# Patient Record
Sex: Male | Born: 1975 | Hispanic: No | Marital: Single | Smoking: Former smoker
Health system: Southern US, Community
[De-identification: ages and names within clinical notes are randomized; demographics above are authoritative.]

## PROBLEM LIST (undated history)

## (undated) ENCOUNTER — Emergency Department (HOSPITAL_COMMUNITY): Payer: Medicaid Other | Source: Home / Self Care

## (undated) DIAGNOSIS — E781 Pure hyperglyceridemia: Secondary | ICD-10-CM

## (undated) DIAGNOSIS — I10 Essential (primary) hypertension: Secondary | ICD-10-CM

## (undated) DIAGNOSIS — R7303 Prediabetes: Secondary | ICD-10-CM

## (undated) DIAGNOSIS — F1010 Alcohol abuse, uncomplicated: Secondary | ICD-10-CM

## (undated) DIAGNOSIS — F1210 Cannabis abuse, uncomplicated: Secondary | ICD-10-CM

## (undated) DIAGNOSIS — M549 Dorsalgia, unspecified: Secondary | ICD-10-CM

## (undated) HISTORY — DX: Cannabis abuse, uncomplicated: F12.10

## (undated) HISTORY — DX: Alcohol abuse, uncomplicated: F10.10

## (undated) HISTORY — DX: Essential (primary) hypertension: I10

## (undated) HISTORY — DX: Prediabetes: R73.03

## (undated) HISTORY — PX: KNEE SURGERY: SHX244

## (undated) HISTORY — DX: Pure hyperglyceridemia: E78.1

---

## 2003-02-28 ENCOUNTER — Encounter: Payer: Self-pay | Admitting: Internal Medicine

## 2003-02-28 ENCOUNTER — Emergency Department (HOSPITAL_COMMUNITY): Admission: EM | Admit: 2003-02-28 | Discharge: 2003-02-28 | Payer: Self-pay | Admitting: Internal Medicine

## 2003-05-18 ENCOUNTER — Ambulatory Visit (HOSPITAL_COMMUNITY): Admission: RE | Admit: 2003-05-18 | Discharge: 2003-05-18 | Payer: Self-pay | Admitting: General Surgery

## 2004-01-13 ENCOUNTER — Emergency Department (HOSPITAL_COMMUNITY): Admission: EM | Admit: 2004-01-13 | Discharge: 2004-01-13 | Payer: Self-pay | Admitting: Emergency Medicine

## 2005-11-22 ENCOUNTER — Emergency Department (HOSPITAL_COMMUNITY): Admission: EM | Admit: 2005-11-22 | Discharge: 2005-11-22 | Payer: Self-pay | Admitting: Emergency Medicine

## 2007-04-13 ENCOUNTER — Emergency Department (HOSPITAL_COMMUNITY): Admission: EM | Admit: 2007-04-13 | Discharge: 2007-04-13 | Payer: Self-pay | Admitting: Emergency Medicine

## 2010-05-28 ENCOUNTER — Encounter: Payer: Self-pay | Admitting: Orthopedic Surgery
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
## 2011-02-12 LAB — CBC
HCT: 42.7
Hemoglobin: 14.4
MCHC: 33.8
MCV: 89.1
Platelets: 241
RBC: 4.8
RDW: 13.3
WBC: 6

## 2011-02-12 LAB — URINALYSIS, ROUTINE W REFLEX MICROSCOPIC
Glucose, UA: 100 — AB
pH: 6

## 2011-02-12 LAB — DIFFERENTIAL
Eosinophils Relative: 1
Lymphocytes Relative: 20
Monocytes Relative: 8
Neutrophils Relative %: 71

## 2011-02-12 LAB — COMPREHENSIVE METABOLIC PANEL
BUN: 13
CO2: 27
Calcium: 9.6
Creatinine, Ser: 1.3
GFR calc Af Amer: >60
GFR calc non Af Amer: >60
Glucose, Bld: 121 — ABNORMAL HIGH

## 2011-02-12 LAB — RAPID URINE DRUG SCREEN, HOSP PERFORMED
Barbiturates: NOT DETECTED
Benzodiazepines: POSITIVE — AB
Cocaine: POSITIVE — AB

## 2011-05-04 ENCOUNTER — Emergency Department (HOSPITAL_COMMUNITY)
Admission: EM | Admit: 2011-05-04 | Discharge: 2011-05-04 | Disposition: A | Discharge status: Home / Self Care | Payer: Medicaid Other | Attending: Emergency Medicine | Admitting: Emergency Medicine

## 2011-05-04 ENCOUNTER — Emergency Department (HOSPITAL_COMMUNITY): Payer: Medicaid Other

## 2011-05-04 ENCOUNTER — Encounter: Payer: Self-pay | Admitting: Emergency Medicine

## 2011-05-04 DIAGNOSIS — S62307A Unspecified fracture of fifth metacarpal bone, left hand, initial encounter for closed fracture: Secondary | ICD-10-CM

## 2011-05-04 DIAGNOSIS — M79609 Pain in unspecified limb: Secondary | ICD-10-CM | POA: Insufficient documentation

## 2011-05-04 DIAGNOSIS — Z79899 Other long term (current) drug therapy: Secondary | ICD-10-CM | POA: Insufficient documentation

## 2011-05-04 DIAGNOSIS — M7989 Other specified soft tissue disorders: Secondary | ICD-10-CM | POA: Insufficient documentation

## 2011-05-04 DIAGNOSIS — S62309A Unspecified fracture of unspecified metacarpal bone, initial encounter for closed fracture: Secondary | ICD-10-CM | POA: Insufficient documentation

## 2011-05-04 MED ORDER — HYDROCODONE-ACETAMINOPHEN 5-325 MG PO TABS: ORAL_TABLET | ORAL | Status: DC

## 2011-05-04 NOTE — ED Notes (Signed)
Pt a/ox4. Resp even and unlabored. NAD at this time. D/C instructions reviewed with pt. Pt verbalized understanding. Pt ambulated to lobby with steady gate.  

## 2011-05-04 NOTE — ED Provider Notes (Signed)
History     CSN: [id number]  Arrival date & time 05/04/11  1750   First MD Initiated Contact with Patient 05/04/11 1858      Chief Complaint  Patient presents with  . Hand Injury    (Consider location/radiation/quality/duration/timing/severity/associated sxs/prior treatment) Patient is a 35 y.o. male presenting with hand injury. The history is provided by the patient.  Hand Injury     History reviewed. No pertinent past medical history.  Past Surgical History  Procedure Date  . Knee surgery     No family history on file.  History  Substance Use Topics  . Smoking status: Never Smoker   . Smokeless tobacco: Not on file  . Alcohol Use: No      Review of Systems  Constitutional: Negative for activity change.       All ROS Neg except as noted in HPI  HENT: Negative for nosebleeds and neck pain.   Eyes: Negative for photophobia and discharge.  Respiratory: Negative for cough, shortness of breath and wheezing.   Cardiovascular: Negative for chest pain and palpitations.  Gastrointestinal: Negative for abdominal pain and blood in stool.  Genitourinary: Negative for dysuria, frequency and hematuria.  Musculoskeletal: Negative for back pain, arthralgias and gait problem.       Hand pain  Skin: Negative.   Neurological: Negative for dizziness, seizures and speech difficulty.  Psychiatric/Behavioral: Negative for hallucinations and confusion.    Allergies  Pork-derived products  Home Medications   Current Outpatient Rx  Name Route Sig Dispense Refill  . VITAMIN C PO Oral Take 1 tablet by mouth daily.      Marland Kitchen VITAMIN D-3 5000 UNITS PO TABS Oral Take 1 capsule by mouth daily.      . B-12 PO Oral Take 1 tablet by mouth daily.      . OMEGA-3 FATTY ACIDS 1000 MG PO CAPS Oral Take 1 g by mouth daily.      . ADULT MULTIVITAMIN W/MINERALS CH Oral Take 1 tablet by mouth daily.      Marland Kitchen HYDROCODONE-ACETAMINOPHEN 5-325 MG PO TABS  1 or 2 tabs by mouth every 4 hours as needed  for pain. 20 tablet 0    BP 115/77  Pulse 68  Temp 98.8 F (37.1 C)  Resp 18  Ht 5\' 7"  (1.702 m)  Wt 220 lb (99.791 kg)  BMI 34.46 kg/m2  SpO2 98%  Physical Exam  Nursing note and vitals reviewed. Constitutional: He is oriented to person, place, and time. He appears well-developed and well-nourished.  Non-toxic appearance.  HENT:  Head: Normocephalic.  Right Ear: Tympanic membrane and external ear normal.  Left Ear: Tympanic membrane and external ear normal.  Eyes: EOM and lids are normal. Pupils are equal, round, and reactive to light.  Neck: Normal range of motion. Neck supple. Carotid bruit is not present.  Cardiovascular: Normal rate, regular rhythm, normal heart sounds, intact distal pulses and normal pulses.   Pulmonary/Chest: Breath sounds normal. No respiratory distress.  Abdominal: Soft. Bowel sounds are normal. There is no tenderness. There is no guarding.  Musculoskeletal: Normal range of motion.       Full range of motion of the left shoulder and elbow. Full range of motion of the left wrist. No pain in the anatomical snuffbox. There is pain and swelling over the dorsum of the fifth left metacarpal. There is good capillary refill. Radial pulses are symmetrical. Sensory is symmetrical.  Lymphadenopathy:       Head (right side): No  submandibular adenopathy present.       Head (left side): No submandibular adenopathy present.    He has no cervical adenopathy.  Neurological: He is alert and oriented to person, place, and time. He has normal strength. No cranial nerve deficit or sensory deficit.  Skin: Skin is warm and dry.  Psychiatric: He has a normal mood and affect. His speech is normal.    ED Course  Procedures (including critical care time) Pulse oximetry 98% on room air. Within normal limits by my interpretation. Labs Reviewed - No data to display Dg Hand Complete Left  05/04/2011  *RADIOLOGY REPORT*  Clinical Data: Pain and swelling post injury  LEFT HAND -  COMPLETE 3+ VIEW  Comparison: None.  Findings: Three views of the left hand submitted.  There is mild displaced oblique fracture of the left fifth metacarpal.  IMPRESSION: Mild displaced oblique fracture of the left fifth metacarpal.  Original Report Authenticated By: Natasha Mead, M.D.     1. Fracture of fifth metacarpal bone of left hand       MDM  I have reviewed nursing notes, vital signs, and all appropriate lab and imaging results for this patient. X-ray reviewed. Patient advised of the fracture. He is to see the orthopedic specialist for full evaluation of his fracture. Prescription for Norco given.       Kathie Dike, [place] 05/04/11 2029
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
## 2011-05-04 NOTE — ED Notes (Signed)
Pt c/o swelling/pain to left hand after closing it in car door last night.

## 2011-05-04 NOTE — ED Notes (Signed)
Ulnar gutter splint and sling applied to left hand/arm. CMS intact NAD at this time.

## 2011-05-05 NOTE — ED Provider Notes (Signed)
Medical screening examination/treatment/procedure(s) were performed by non-physician practitioner and as supervising physician I was immediately available for consultation/collaboration.   Joya Gaskins, MD 05/05/11 [phone]

## 2011-05-14 ENCOUNTER — Encounter: Payer: Self-pay | Admitting: Orthopedic Surgery

## 2011-05-14 ENCOUNTER — Ambulatory Visit (INDEPENDENT_AMBULATORY_CARE_PROVIDER_SITE_OTHER): Payer: Medicaid Other | Admitting: Orthopedic Surgery

## 2011-05-14 VITALS — BP 120/64 | Ht 67.0 in | Wt 220.0 lb

## 2011-05-14 DIAGNOSIS — S62309A Unspecified fracture of unspecified metacarpal bone, initial encounter for closed fracture: Secondary | ICD-10-CM

## 2011-05-14 MED ORDER — HYDROCODONE-ACETAMINOPHEN 10-325 MG PO TABS: 1.0000 | ORAL_TABLET | ORAL | Status: AC | PRN

## 2011-05-14 NOTE — Patient Instructions (Signed)
Keep  Cast dry   Do not get wet   If it gets wet dry with a hair dryer on low setting and call the office   
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
## 2011-06-25 ENCOUNTER — Other Ambulatory Visit: Payer: Self-pay | Admitting: Orthopedic Surgery

## 2011-06-26 ENCOUNTER — Telehealth: Payer: Self-pay | Admitting: *Deleted

## 2011-06-26 NOTE — Telephone Encounter (Signed)
We addressed this one yesterday   He didn't come in for his appt so no meds

## 2011-07-03 NOTE — Telephone Encounter (Signed)
NOTED

## 2011-07-05 ENCOUNTER — Ambulatory Visit (INDEPENDENT_AMBULATORY_CARE_PROVIDER_SITE_OTHER): Payer: Medicaid Other | Admitting: Orthopedic Surgery

## 2011-07-05 ENCOUNTER — Encounter: Payer: Self-pay | Admitting: Orthopedic Surgery

## 2011-07-05 DIAGNOSIS — S62309A Unspecified fracture of unspecified metacarpal bone, initial encounter for closed fracture: Secondary | ICD-10-CM

## 2011-07-05 NOTE — Progress Notes (Signed)
Patient ID: Steve Marshall, male   DOB: 1975/11/28, 36 y.o.   MRN: [id number] Chief Complaint  Patient presents with  . Follow-up    recheck and xray left 5th metacarpal fracture, DOI 05/04/11   Impression LEFT fifth metacarpal fracture  Plan short arm cast for one month with an x-ray out of plaster  The patient removed the cast himself.  His x-ray shows that the fracture healed.  His range of motion is returned to normal.  Fracture site was nontender.  X-ray report dictated.

## 2011-07-05 NOTE — Progress Notes (Signed)
AP lateral, oblique, LEFT hand.  LEFT hand fracture 5th metacarpal.  The fracture was oblique spiral healed in good position.  Impression healed fracture, 5th metacarpal shaft

## 2011-07-05 NOTE — Patient Instructions (Signed)
Activity as tolerated

## 2011-09-03 ENCOUNTER — Ambulatory Visit (HOSPITAL_COMMUNITY)
Admission: RE | Admit: 2011-09-03 | Discharge: 2011-09-03 | Disposition: A | Discharge status: Home / Self Care | Payer: Medicaid Other | Source: Ambulatory Visit | Attending: Orthopedic Surgery | Admitting: Orthopedic Surgery

## 2011-09-03 DIAGNOSIS — IMO0001 Reserved for inherently not codable concepts without codable children: Secondary | ICD-10-CM | POA: Insufficient documentation

## 2011-09-03 DIAGNOSIS — M25569 Pain in unspecified knee: Secondary | ICD-10-CM | POA: Insufficient documentation

## 2011-09-03 DIAGNOSIS — M6281 Muscle weakness (generalized): Secondary | ICD-10-CM | POA: Insufficient documentation
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
## 2011-09-05 ENCOUNTER — Ambulatory Visit (HOSPITAL_COMMUNITY)
Admission: RE | Admit: 2011-09-05 | Discharge: 2011-09-05 | Disposition: A | Discharge status: Home / Self Care | Payer: Medicaid Other | Source: Ambulatory Visit | Attending: Orthopedic Surgery | Admitting: Orthopedic Surgery

## 2011-09-05 DIAGNOSIS — M6281 Muscle weakness (generalized): Secondary | ICD-10-CM | POA: Insufficient documentation

## 2011-09-05 DIAGNOSIS — M25569 Pain in unspecified knee: Secondary | ICD-10-CM | POA: Insufficient documentation

## 2011-09-05 DIAGNOSIS — IMO0001 Reserved for inherently not codable concepts without codable children: Secondary | ICD-10-CM | POA: Insufficient documentation

## 2011-09-05 NOTE — Progress Notes (Signed)
Physical Therapy Treatment Patient Details  Name: Steve Marshall MRN: [id number] Date of Birth: [birth year]  Today's Date: 09/05/2011 Time: 4098-1191 PT Time Calculation (min): 56 min  Visit#: 2  of 12   Re-eval: 10/03/11  Charge: therex 45 min Ice 10 min  Authorization: Medicaid   Authorization Time Period: 09/05/2011-10/17/2011  submitted not approved yet check again next session  Authorization Visit#: 0  of 12    Subjective: Symptoms/Limitations Symptoms: L knee pain scale 6/10, pt had brace on knee at entrance. Pain Assessment Currently in Pain?: Yes Pain Score:   6 Pain Location: Knee Pain Orientation: Left  Objective:   Exercise/Treatments Stretches Active Hamstring Stretch: 3 reps;30 seconds;Limitations Active Hamstring Stretch Limitations: with rope Quad Stretch: 3 reps;30 seconds Aerobic Elliptical: 5' @ L1 Standing Lateral Step Up: 10 reps;Hand Hold: 1;Step Height: 4" Forward Step Up: 10 reps;Hand Hold: 0;Step Height: 4" Functional Squat: 10 reps;3 seconds Rocker Board: 2 minutes;Limitations Rocker Board Limitations: R/L Walking with Sports Cord: small sports cord 3x each direction Supine Quad Sets: 10 reps Bridges: 10 reps Straight Leg Raises: Left;5 reps Sidelying Hip ABduction: 10 reps Hip ADduction: 10 reps Prone  Hamstring Curl: 10 reps Hip Extension: 10 reps  Physical Therapy Assessment and Plan PT Assessment and Plan Clinical Impression Statement: Began therex per PT plan for overall strengthening.  Pt with most difficulty with proper form/technique with lateral step up due to weakness and instability.  Pt limited endurance noted on elliptical.  Able to reduce knee swelling with elevated ice with improved gait mechanics noted, pt stated pain scale same at entrance.   Pt will benefit from skilled therapeutic intervention in order to improve on the following deficits: Decreased strength;Increased edema PT Plan: Progress knee  strengthening/instability, review HEP for compliance and proper technique.  Keep elliptical for strengthening/endurance training or switch to TM for walking endurance    Goals    Problem List There is no problem list on file for this patient.   PT - End of Session Activity Tolerance: Patient tolerated treatment well General Behavior During Session: [facility] for tasks performed Cognition: [facility] for tasks performed  GP No functional reporting required  Juel Burrow, PTA 09/05/2011, 10:35 AM

## 2011-09-11 ENCOUNTER — Ambulatory Visit (HOSPITAL_COMMUNITY)
Admission: RE | Admit: 2011-09-11 | Discharge: 2011-09-11 | Disposition: A | Discharge status: Home / Self Care | Payer: Medicaid Other | Source: Ambulatory Visit | Attending: Orthopedic Surgery | Admitting: Orthopedic Surgery

## 2011-09-11 NOTE — Progress Notes (Signed)
Physical Therapy Treatment Patient Details  Name: Steve Marshall MRN: [id number] Date of Birth: 08-08-1975  Today's Date: 09/11/2011 Time: 4098-1191 PT Time Calculation (min): 43 min Visit#: 3  of 12   Re-eval: 10/03/11 Authorization: Medicaid  Authorization Time Period: 09/05/2011-10/17/2011  Authorization Visit#: 2  of 3   Charges: Therex x 38'  Subjective: Symptoms/Limitations Symptoms: Pt states that his pain is the same as usual. Pain Assessment Currently in Pain?: Yes Pain Score:   6 Pain Location: Knee Pain Orientation: Left  Exercise/Treatments Stretches Active Hamstring Stretch: 3 reps;30 seconds;Limitations Active Hamstring Stretch Limitations: with rope Aerobic Elliptical: 5' @ L1 to improve strength and endurance Standing Lateral Step Up: 15 reps;Left;Step Height: 4" Forward Step Up: 15 reps;Left;Step Height: 4" Functional Squat: 15 reps Rocker Board: 2 minutes;Limitations Rocker Board Limitations: R/L Walking with Sports Cord: Thick sports cord 5x each direction Supine Quad Sets: 10 reps Bridges: 10 reps Straight Leg Raises: 10 reps;Left  Physical Therapy Assessment and Plan PT Assessment and Plan Clinical Impression Statement: Pt displays decreased power and endurance. Pt requires vc's to increase effort put forth with elliptical and sports cord exercises. Pt was given financial counselor's number as next visit wiill be the last visit covered by medicaid. When asked about HEP compliance pt reports that he did all of these exercises for 3 years and they did not help. Pt states that he is not doing them at home. Pt educated on the current state of his mm strength and advised to complete his exercises to improve mm strength. Pt reports no increase in pain at end of session. PT Plan: Continue to progress per PT POC. Have pt fill out financial form if he wishes to continue after insurance authorization date.     Problem List There is no problem list on file for  this patient.   PT - End of Session Activity Tolerance: Patient tolerated treatment well General Behavior During Session: [facility] for tasks performed Cognition: [facility] for tasks performed   Seth Bake, PTA 09/11/2011, 10:29 AM

## 2011-09-13 ENCOUNTER — Ambulatory Visit (HOSPITAL_COMMUNITY): Payer: Medicaid Other | Admitting: Physical Therapy

## 2011-09-13 ENCOUNTER — Telehealth (HOSPITAL_COMMUNITY): Payer: Self-pay

## 2011-09-18 ENCOUNTER — Ambulatory Visit (HOSPITAL_COMMUNITY): Payer: Medicaid Other | Admitting: *Deleted

## 2011-09-20 ENCOUNTER — Ambulatory Visit (HOSPITAL_COMMUNITY)
Admission: RE | Admit: 2011-09-20 | Discharge: 2011-09-20 | Disposition: A | Discharge status: Home / Self Care | Payer: Medicaid Other | Source: Ambulatory Visit | Attending: Orthopedic Surgery | Admitting: Orthopedic Surgery

## 2011-09-20 NOTE — Progress Notes (Signed)
Physical Therapy Treatment Patient Details  Name: Steve Marshall MRN: [id number] Date of Birth: 06-Sep-1975  Today's Date: 09/20/2011 Time: 4098-1191 PT Time Calculation (min): 33 min Visit#: 4  of 12   Authorization: Medicaid   Authorization Time Period: 09/05/2011-10/17/2011   Authorization Visit#: 3  of 3   Charges:  therex 30'  Subjective: Symptoms/Limitations Symptoms: Pt. was 64' late for appt.  Pt reports his pain is the same and " it's not getting any better because it's out of place". Pain Assessment Currently in Pain?: Yes Pain Score:   6 Pain Location: Knee Pain Orientation: Left   Exercise/Treatments Aerobic Elliptical: 5' @ L1 to increase activity tolerance Standing Lateral Step Up: 15 reps;Step Height: 4";Hand Hold: 1 Forward Step Up: 15 reps;Step Height: 4";Hand Hold: 1 Step Down: 15 reps;Step Height: 4";Hand Hold: 1 Functional Squat: 15 reps Rocker Board: 2 minutes;Limitations Rocker Board Limitations: R/L Walking with Sports Cord: Thick sports cord 5x each direction Seated Long Arc Quad:  (time) Supine Quad Sets:  (time) Bridges:  (time) Straight Leg Raises:  (time) Sidelying Hip ABduction:  (time) Hip ADduction:  (time) Prone  Hamstring Curl:  (time) Hip Extension:  (time)      Physical Therapy Assessment and Plan PT Assessment and Plan Clinical Impression Statement: Pt. was 64' late for appt; unable to complete all exercises.  Spoke to pt. regarding continuing and speaking to financial dept. to assist, however pt. does not wish to continue therapy at this time.  Pt. requires multimodal cues to decrease L lateral trunk lean with all activites.  Pt.  with exaggerated movements, increased time to complete activities with alot of UE substitution. PT Plan: Discharge to HEP per pt. request.     PT - End of Session Activity Tolerance: Patient tolerated treatment well General Behavior During Session: [facility] for tasks performed Cognition: [facility] for  tasks performed   Capricia Serda B. Bascom Levels, PTA 09/20/2011, 10:18 AM

## 2011-09-25 ENCOUNTER — Ambulatory Visit (HOSPITAL_COMMUNITY): Payer: Medicaid Other | Admitting: Physical Therapy

## 2011-09-27 ENCOUNTER — Ambulatory Visit (HOSPITAL_COMMUNITY): Payer: Medicaid Other | Admitting: Physical Therapy

## 2013-02-01 ENCOUNTER — Encounter (HOSPITAL_COMMUNITY): Payer: Self-pay | Admitting: *Deleted

## 2013-02-01 ENCOUNTER — Emergency Department (HOSPITAL_COMMUNITY): Payer: Medicaid Other

## 2013-02-01 ENCOUNTER — Emergency Department (HOSPITAL_COMMUNITY)
Admission: EM | Admit: 2013-02-01 | Discharge: 2013-02-01 | Disposition: A | Discharge status: Home / Self Care | Payer: Medicaid Other | Attending: Emergency Medicine | Admitting: Emergency Medicine

## 2013-02-01 DIAGNOSIS — S93409A Sprain of unspecified ligament of unspecified ankle, initial encounter: Secondary | ICD-10-CM | POA: Insufficient documentation

## 2013-02-01 DIAGNOSIS — Y9289 Other specified places as the place of occurrence of the external cause: Secondary | ICD-10-CM | POA: Insufficient documentation

## 2013-02-01 DIAGNOSIS — X500XXA Overexertion from strenuous movement or load, initial encounter: Secondary | ICD-10-CM | POA: Insufficient documentation

## 2013-02-01 DIAGNOSIS — S93402A Sprain of unspecified ligament of left ankle, initial encounter: Secondary | ICD-10-CM

## 2013-02-01 DIAGNOSIS — Y9389 Activity, other specified: Secondary | ICD-10-CM | POA: Insufficient documentation

## 2013-02-01 DIAGNOSIS — Z9889 Other specified postprocedural states: Secondary | ICD-10-CM | POA: Insufficient documentation

## 2013-02-01 MED ORDER — HYDROCODONE-ACETAMINOPHEN 5-325 MG PO TABS: 1.0000 | ORAL_TABLET | ORAL | Status: DC | PRN

## 2013-02-01 NOTE — ED Provider Notes (Signed)
CSN: [id number]     Arrival date & time 02/01/13  1008 History   First MD Initiated Contact with Patient 02/01/13 1018     Chief Complaint  Patient presents with  . Ankle Pain   (Consider location/radiation/quality/duration/timing/severity/associated sxs/prior Treatment) Patient is a 37 y.o. male presenting with ankle pain. The history is provided by the patient.  Ankle Pain Location:  Ankle Time since incident:  1 day Injury: yes   Mechanism of injury comment:  Twisted Ankle location:  L ankle Pain details:    Quality:  Shooting   Radiates to:  Does not radiate   Severity:  Severe   Onset quality:  Sudden   Timing:  Constant   Progression:  Worsening Chronicity:  New Dislocation: no   Foreign body present:  No foreign bodies Prior injury to area:  No Relieved by:  Nothing Worsened by:  Activity and bearing weight Ineffective treatments:  Elevation Associated symptoms: swelling   Associated symptoms: no fever and no neck pain    Steve Marshall is a 37 y.o. male who presents to the ED with left ankle pain. The pain started yesterday when he was going down the steps and twisted his ankle. He doesn't remember exactly which way it turned but he had immediate pain. He has been elevating the area and trying to stay off ov it as much as possible. Today the pain is worse and there is swelling.   History reviewed. No pertinent past medical history. Past Surgical History  Procedure Laterality Date  . Knee surgery     Family History  Problem Relation Age of Onset  . Arthritis    . Asthma    . Diabetes     History  Substance Use Topics  . Smoking status: Never Smoker   . Smokeless tobacco: Not on file  . Alcohol Use: No    Review of Systems  Constitutional: Negative for fever.  HENT: Negative for neck pain.   Eyes: Negative for visual disturbance.  Respiratory: Negative for shortness of breath.   Cardiovascular: Negative for chest pain.  Gastrointestinal: Negative for  nausea and vomiting.  Musculoskeletal:       Left ankle pain  Skin: Negative for wound.  Neurological: Negative for headaches.  Psychiatric/Behavioral: The patient is not nervous/anxious.     Allergies  Ibuprofen and Pork-derived products  Home Medications  No current outpatient prescriptions on file. BP 135/80  Pulse 92  Temp(Src) 99.1 F (37.3 C) (Oral)  Resp 18  Ht 5\' 7"  (1.702 m)  Wt 200 lb (90.719 kg)  BMI 31.32 kg/m2  SpO2 100% Physical Exam  Nursing note and vitals reviewed. Constitutional: He is oriented to person, place, and time. He appears well-developed and well-nourished. No distress.  HENT:  Head: Normocephalic and atraumatic.  Eyes: Conjunctivae and EOM are normal.  Neck: Neck supple.  Cardiovascular: Normal rate.   Pulmonary/Chest: Effort normal.  Musculoskeletal:       Left ankle: He exhibits decreased range of motion. He exhibits no swelling, no deformity, no laceration and normal pulse. Tenderness. Lateral malleolus tenderness found. Achilles tendon normal.       Feet:  Neurological: He is alert and oriented to person, place, and time. He has normal strength. No cranial nerve deficit or sensory deficit. Abnormal gait: due to pain.  Pedal pulses equal, adequate circulation.  Skin: Skin is warm and dry.  Psychiatric: He has a normal mood and affect. His behavior is normal.   Dg Ankle Complete  Left  02/01/2013   *RADIOLOGY REPORT*  Clinical Data: Ankle pain, slipped on steps  LEFT ANKLE COMPLETE - 3+ VIEW  Comparison: None.  Findings: Three radiographs of the left ankle demonstrate no acute fracture, malalignment, significant soft tissue swelling or ankle joint effusion.  The ankle mortise is congruent.  The talar dome appears intact. Normal osseous mineralization.  IMPRESSION: No acute osseous abnormality.   Original Report Authenticated By: Malachy Moan, M.D.    ED Course  Procedures MDM  37 y.o. male with left ankle sprain. Will place in ASO and  treat pain. He will elevate, apply ice and follow up as needed. Patient stable for discharge home without any immediate complications. He remains neurovascularly intact. No concern for compartment syndrome at this time.  I have reviewed this patient's vital signs, nurses notes, appropriate  Imaging and discussed findings with the patient. He voices understanding.   Medication List         HYDROcodone-acetaminophen 5-325 MG per tablet  Commonly known as:  NORCO/VICODIN  Take 1 tablet by mouth every 4 (four) hours as needed.           [place], [place] 02/01/13 [phone]

## 2013-02-01 NOTE — ED Notes (Signed)
Pt twisted left ankle while coming down steps yesterday, pt c/o pain and swelling to left ankle, cms intact distal.

## 2013-02-09 NOTE — ED Provider Notes (Signed)
Medical screening examination/treatment/procedure(s) were performed by non-physician practitioner and as supervising physician I was immediately available for consultation/collaboration. Devoria Albe, MD, Armando Gang   Ward Givens, MD 02/09/13 [phone]

## 2013-10-03 ENCOUNTER — Encounter (HOSPITAL_COMMUNITY): Payer: Self-pay | Admitting: Emergency Medicine

## 2013-10-03 ENCOUNTER — Emergency Department (HOSPITAL_COMMUNITY): Payer: Medicaid Other

## 2013-10-03 ENCOUNTER — Emergency Department (HOSPITAL_COMMUNITY)
Admission: EM | Admit: 2013-10-03 | Discharge: 2013-10-03 | Disposition: A | Discharge status: Home / Self Care | Payer: Medicaid Other | Attending: Emergency Medicine | Admitting: Emergency Medicine

## 2013-10-03 DIAGNOSIS — S139XXA Sprain of joints and ligaments of unspecified parts of neck, initial encounter: Secondary | ICD-10-CM | POA: Insufficient documentation

## 2013-10-03 DIAGNOSIS — Z87891 Personal history of nicotine dependence: Secondary | ICD-10-CM | POA: Insufficient documentation

## 2013-10-03 DIAGNOSIS — Y9389 Activity, other specified: Secondary | ICD-10-CM | POA: Insufficient documentation

## 2013-10-03 DIAGNOSIS — X503XXA Overexertion from repetitive movements, initial encounter: Secondary | ICD-10-CM | POA: Insufficient documentation

## 2013-10-03 DIAGNOSIS — M25519 Pain in unspecified shoulder: Secondary | ICD-10-CM | POA: Insufficient documentation

## 2013-10-03 DIAGNOSIS — S161XXA Strain of muscle, fascia and tendon at neck level, initial encounter: Secondary | ICD-10-CM

## 2013-10-03 DIAGNOSIS — Y929 Unspecified place or not applicable: Secondary | ICD-10-CM | POA: Insufficient documentation

## 2013-10-03 DIAGNOSIS — Z79899 Other long term (current) drug therapy: Secondary | ICD-10-CM | POA: Insufficient documentation

## 2013-10-03 MED ORDER — HYDROCODONE-ACETAMINOPHEN 5-325 MG PO TABS: ORAL_TABLET | ORAL | Status: DC

## 2013-10-03 MED ORDER — CYCLOBENZAPRINE HCL 10 MG PO TABS: 10.0000 mg | ORAL_TABLET | Freq: Three times a day (TID) | ORAL | Status: DC | PRN

## 2013-10-03 NOTE — Discharge Instructions (Signed)
Cervical Sprain °A cervical sprain is when the tissues (ligaments) that hold the neck bones in place stretch or tear. °HOME CARE  °· Put ice on the injured area. °· Put ice in a plastic bag. °· Place a towel between your skin and the bag. °· Leave the ice on for 15 20 minutes, 3 4 times a day. °· You may have been given a collar to wear. This collar keeps your neck from moving while you heal. °· Do not take the collar off unless told by your doctor. °· If you have long hair, keep it outside of the collar. °· Ask your doctor before changing the position of your collar. You may need to change its position over time to make it more comfortable. °· If you are allowed to take off the collar for cleaning or bathing, follow your doctor's instructions on how to do it safely. °· Keep your collar clean by wiping it with mild soap and water. Dry it completely. If the collar has removable pads, remove them every 1 2 days to hand wash them with soap and water. Allow them to air dry. They should be dry before you wear them in the collar. °· Do not drive while wearing the collar. °· Only take medicine as told by your doctor. °· Keep all doctor visits as told. °· Keep all physical therapy visits as told. °· Adjust your work station so that you have good posture while you work. °· Avoid positions and activities that make your problems worse. °· Warm up and stretch before being active. °GET HELP IF: °· Your pain is not controlled with medicine. °· You cannot take less pain medicine over time as planned. °· Your activity level does not improve as expected. °GET HELP RIGHT AWAY IF:  °· You are bleeding. °· Your stomach is upset. °· You have an allergic reaction to your medicine. °· You develop new problems that you cannot explain. °· You lose feeling (become numb) or you cannot move any part of your body (paralysis). °· You have tingling or weakness in any part of your body. °· Your symptoms get worse. Symptoms include: °· Pain,  soreness, stiffness, puffiness (swelling), or a burning feeling in your neck. °· Pain when your neck is touched. °· Shoulder or upper back pain. °· Limited ability to move your neck. °· Headache. °· Dizziness. °· Your hands or arms feel week, lose feeling, or tingle. °· Muscle spasms. °· Difficulty swallowing or chewing. °MAKE SURE YOU:  °· Understand these instructions. °· Will watch your condition. °· Will get help right away if you are not doing well or get worse. °Document Released: 10/10/2007 Document Revised: 12/24/2012 Document Reviewed: 10/29/2012 °ExitCare® Patient Information ©2014 [employer]. ° °
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
## 2013-10-04 NOTE — ED Provider Notes (Signed)
Medical screening examination/treatment/procedure(s) were performed by non-physician practitioner and as supervising physician I was immediately available for consultation/collaboration.   EKG Interpretation None       Donnetta Hutching, MD 10/04/13 [phone]

## 2014-09-05 ENCOUNTER — Emergency Department (HOSPITAL_COMMUNITY)
Admission: EM | Admit: 2014-09-05 | Discharge: 2014-09-05 | Disposition: A | Discharge status: Home / Self Care | Payer: Medicaid Other | Attending: Emergency Medicine | Admitting: Emergency Medicine

## 2014-09-05 ENCOUNTER — Encounter (HOSPITAL_COMMUNITY): Payer: Self-pay | Admitting: Emergency Medicine

## 2014-09-05 DIAGNOSIS — M5412 Radiculopathy, cervical region: Secondary | ICD-10-CM | POA: Insufficient documentation

## 2014-09-05 DIAGNOSIS — M542 Cervicalgia: Secondary | ICD-10-CM | POA: Diagnosis present

## 2014-09-05 DIAGNOSIS — Z79899 Other long term (current) drug therapy: Secondary | ICD-10-CM | POA: Insufficient documentation

## 2014-09-05 DIAGNOSIS — Z87891 Personal history of nicotine dependence: Secondary | ICD-10-CM | POA: Insufficient documentation

## 2014-09-05 MED ORDER — CYCLOBENZAPRINE HCL 10 MG PO TABS: 10.0000 mg | ORAL_TABLET | Freq: Three times a day (TID) | ORAL | Status: DC | PRN

## 2014-09-05 MED ORDER — HYDROCODONE-ACETAMINOPHEN 5-325 MG PO TABS: ORAL_TABLET | ORAL | Status: DC

## 2014-09-05 MED ORDER — PREDNISONE 10 MG PO TABS: ORAL_TABLET | ORAL | Status: DC

## 2014-09-05 NOTE — Discharge Instructions (Signed)
Cervical Radiculopathy °Cervical radiculopathy means a nerve in the neck is pinched or bruised. This can cause pain or loss of feeling (numbness) that runs from your neck to your arm and fingers. °HOME CARE  °· Put ice on the injured or painful area. °¨ Put ice in a plastic bag. °¨ Place a towel between your skin and the bag. °¨ Leave the ice on for 15-20 minutes, 03-04 times a day, or as told by your doctor. °· If ice does not help, you can try using heat. Take a warm shower or bath, or use a hot water bottle as told by your doctor. °· You may try a gentle neck and shoulder massage. °· Use a flat pillow when you sleep. °· Only take medicines as told by your doctor. °· Keep all physical therapy visits as told by your doctor. °· If you are given a soft collar, wear it as told by your doctor. °GET HELP RIGHT AWAY IF:  °· Your pain gets worse and is not controlled with medicine. °· You lose feeling or feel weak in your hand, arm, face, or leg. °· You have a fever or stiff neck. °· You cannot control when you poop or pee (incontinence). °· You have trouble with walking, balance, or speaking. °MAKE SURE YOU:  °· Understand these instructions. °· Will watch your condition. °· Will get help right away if you are not doing well or get worse. °Document Released: 04/12/2011 Document Revised: 07/16/2011 Document Reviewed: 04/12/2011 °ExitCare® Patient Information ©2015 [employer]. This information is not intended to replace advice given to you by your health care provider. Make sure you discuss any questions you have with your health care provider. ° °
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
## 2014-09-21 ENCOUNTER — Emergency Department (HOSPITAL_COMMUNITY): Payer: Medicaid Other

## 2014-09-21 ENCOUNTER — Emergency Department (HOSPITAL_COMMUNITY)
Admission: EM | Admit: 2014-09-21 | Discharge: 2014-09-21 | Disposition: A | Discharge status: Home / Self Care | Payer: Medicaid Other | Attending: Emergency Medicine | Admitting: Emergency Medicine

## 2014-09-21 ENCOUNTER — Encounter (HOSPITAL_COMMUNITY): Payer: Self-pay | Admitting: Emergency Medicine

## 2014-09-21 DIAGNOSIS — Z87891 Personal history of nicotine dependence: Secondary | ICD-10-CM | POA: Diagnosis not present

## 2014-09-21 DIAGNOSIS — Z79899 Other long term (current) drug therapy: Secondary | ICD-10-CM | POA: Diagnosis not present

## 2014-09-21 DIAGNOSIS — G8929 Other chronic pain: Secondary | ICD-10-CM

## 2014-09-21 DIAGNOSIS — M5412 Radiculopathy, cervical region: Secondary | ICD-10-CM | POA: Diagnosis not present

## 2014-09-21 DIAGNOSIS — M542 Cervicalgia: Secondary | ICD-10-CM | POA: Diagnosis present

## 2014-09-21 MED ORDER — TRAMADOL HCL 50 MG PO TABS
50.0000 mg | ORAL_TABLET | Freq: Four times a day (QID) | ORAL | Status: DC | PRN
Start: 2014-09-21 — End: 2022-09-18
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
## 2014-09-21 NOTE — Discharge Instructions (Signed)
Cervical Radiculopathy °Cervical radiculopathy means a nerve in the neck is pinched or bruised. This can cause pain or loss of feeling (numbness) that runs from your neck to your arm and fingers. °HOME CARE  °· Put ice on the injured or painful area. °¨ Put ice in a plastic bag. °¨ Place a towel between your skin and the bag. °¨ Leave the ice on for 15-20 minutes, 03-04 times a day, or as told by your doctor. °· If ice does not help, you can try using heat. Take a warm shower or bath, or use a hot water bottle as told by your doctor. °· You may try a gentle neck and shoulder massage. °· Use a flat pillow when you sleep. °· Only take medicines as told by your doctor. °· Keep all physical therapy visits as told by your doctor. °· If you are given a soft collar, wear it as told by your doctor. °GET HELP RIGHT AWAY IF:  °· Your pain gets worse and is not controlled with medicine. °· You lose feeling or feel weak in your hand, arm, face, or leg. °· You have a fever or stiff neck. °· You cannot control when you poop or pee (incontinence). °· You have trouble with walking, balance, or speaking. °MAKE SURE YOU:  °· Understand these instructions. °· Will watch your condition. °· Will get help right away if you are not doing well or get worse. °Document Released: 04/12/2011 Document Revised: 07/16/2011 Document Reviewed: 04/12/2011 °ExitCare® Patient Information ©2015 [employer]. This information is not intended to replace advice given to you by your health care provider. Make sure you discuss any questions you have with your health care provider. ° °

## 2014-09-21 NOTE — ED Notes (Signed)
PA Tammy at bedside.  

## 2014-09-21 NOTE — ED Notes (Signed)
PT c/o increasing chronic pain x3 weeks. PT reports having a follow up apt with health department on 09/29/14 for referral to orthopedic physician. PT states no new injury to neck and has had pain since 2010.
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
## 2019-07-02 ENCOUNTER — Ambulatory Visit: Payer: Self-pay

## 2019-08-14 ENCOUNTER — Ambulatory Visit: Payer: Medicaid Other

## 2022-06-05 ENCOUNTER — Emergency Department (HOSPITAL_COMMUNITY): Payer: Commercial Managed Care - HMO

## 2022-06-05 ENCOUNTER — Emergency Department (HOSPITAL_COMMUNITY)
Admission: EM | Admit: 2022-06-05 | Discharge: 2022-06-05 | Disposition: A | Discharge status: Home / Self Care | Payer: Commercial Managed Care - HMO | Attending: Emergency Medicine | Admitting: Emergency Medicine

## 2022-06-05 ENCOUNTER — Other Ambulatory Visit: Payer: Self-pay

## 2022-06-05 DIAGNOSIS — K5900 Constipation, unspecified: Secondary | ICD-10-CM | POA: Diagnosis not present

## 2022-06-05 DIAGNOSIS — M549 Dorsalgia, unspecified: Secondary | ICD-10-CM | POA: Diagnosis not present

## 2022-06-05 DIAGNOSIS — R519 Headache, unspecified: Secondary | ICD-10-CM | POA: Insufficient documentation

## 2022-06-05 DIAGNOSIS — Z87891 Personal history of nicotine dependence: Secondary | ICD-10-CM | POA: Insufficient documentation

## 2022-06-05 DIAGNOSIS — R1032 Left lower quadrant pain: Secondary | ICD-10-CM

## 2022-06-05 DIAGNOSIS — K4090 Unilateral inguinal hernia, without obstruction or gangrene, not specified as recurrent: Secondary | ICD-10-CM | POA: Insufficient documentation

## 2022-06-05 LAB — URINALYSIS, ROUTINE W REFLEX MICROSCOPIC
Bilirubin Urine: NEGATIVE
Glucose, UA: 150 mg/dL — AB
Hgb urine dipstick: NEGATIVE
Ketones, ur: NEGATIVE mg/dL
Leukocytes,Ua: NEGATIVE
Nitrite: NEGATIVE
Protein, ur: NEGATIVE mg/dL
Specific Gravity, Urine: 1.02 (ref 1.005–1.030)
pH: 6 (ref 5.0–8.0)

## 2022-06-05 MED ORDER — POLYETHYLENE GLYCOL 3350 17 G PO PACK: 17.0000 g | PACK | Freq: Every day | ORAL | 1 refills | Status: DC

## 2022-06-05 MED ORDER — ACETAMINOPHEN ER 650 MG PO TBCR: 650.0000 mg | EXTENDED_RELEASE_TABLET | Freq: Three times a day (TID) | ORAL | 0 refills | Status: DC | PRN

## 2022-06-05 MED ORDER — HYDROCODONE-ACETAMINOPHEN 5-325 MG PO TABS
1.0000 | ORAL_TABLET | Freq: Once | ORAL | Status: AC
  Administered 2022-06-05: 1 via ORAL
  Filled 2022-06-05: qty 1
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
## 2022-06-05 NOTE — ED Provider Notes (Signed)
Care of patient assumed from Dr. Sedonia Small.  This patient endorses chronic left inguinal discomfort secondary to a fat-containing hernia.  He has had recent dysuria.  He is currently awaiting urinalysis.  Following that, he can be discharged home +/- antibiotics. Physical Exam  BP (!) 156/104   Pulse 88   Temp 98.3 F (36.8 C) (Oral)   Resp 16   SpO2 96%   Physical Exam Vitals and nursing note reviewed.  Constitutional:      General: He is not in acute distress.    Appearance: He is well-developed. He is not ill-appearing, toxic-appearing or diaphoretic.  HENT:     Head: Normocephalic and atraumatic.  Eyes:     General: No scleral icterus.    Extraocular Movements: Extraocular movements intact.     Conjunctiva/sclera: Conjunctivae normal.  Cardiovascular:     Rate and Rhythm: Normal rate and regular rhythm.  Pulmonary:     Effort: Pulmonary effort is normal. No respiratory distress.  Abdominal:     Palpations: Abdomen is soft.  Musculoskeletal:        General: No swelling.     Cervical back: Neck supple.  Skin:    General: Skin is warm and dry.  Neurological:     General: No focal deficit present.     Mental Status: He is alert and oriented to person, place, and time.  Psychiatric:        Mood and Affect: Mood normal.        Behavior: Behavior normal.     Procedures  Procedures  ED Course / MDM    Medical Decision Making Amount and/or Complexity of Data Reviewed Labs: ordered. Radiology: ordered.  Risk OTC drugs. Prescription drug management.   On assessment, patient sleeping on ED stretcher.  He is easily awakened.  He endorses ongoing mild pain in his left lower quadrant.  On CT scan, there is a small fat-containing left inguinal hernia.  There is also a large stool burden.  Patient reports that he did have a bowel movement yesterday.  He was counseled on over-the-counter stool softeners to help relieve his constipation and stool burden, which also may improve his  symptoms.  There was mild prostatomegaly on CT scan as well.  Patient has never seen a urologist in the past.  He was advised that he can follow-up with general surgery for chronic pain secondary to his inguinal hernia.  He is also advised to establish care with urology for possible further testing in regards to his prostate.  Urinalysis showed no evidence of infection.  Patient was discharged in good condition.       Godfrey Pick, MD 06/05/22 [phone]

## 2022-06-05 NOTE — ED Notes (Signed)
ED Provider at bedside. 

## 2022-06-05 NOTE — ED Notes (Signed)
Patient transported to CT 

## 2022-06-05 NOTE — Discharge Instructions (Addendum)
You were evaluated in the Emergency Department and after careful evaluation, we did not find any emergent condition requiring admission or further testing in the hospital.  Your exam/testing today is overall reassuring.  Symptoms likely due to a small hernia and/or constipation.  Recommend follow-up with the surgeons to see if they want to do surgery in the future.  Use the Tylenol as directed for pain.  Also recommend using the MiraLAX up to 3 times daily to treat your constipation, once you start having soft bowel movements you can decrease this medicine to once a day.  Your CT scan showed an enlarged prostate gland.  There is a telephone number below to call to set up a follow-up appointment with urology for possible further testing.  Please return to the Emergency Department if you experience any worsening of your condition.   Thank you for allowing [place] to be a part of your care.

## 2022-06-05 NOTE — ED Triage Notes (Signed)
Pt presents with abdominal pain x couple months in the left lower abdomen. Rates pain 10/10. Pt endorses burning while urinating.

## 2022-07-16 DIAGNOSIS — M542 Cervicalgia: Secondary | ICD-10-CM | POA: Diagnosis not present

## 2022-07-16 DIAGNOSIS — M546 Pain in thoracic spine: Secondary | ICD-10-CM | POA: Diagnosis not present

## 2022-08-10 ENCOUNTER — Encounter (HOSPITAL_COMMUNITY): Payer: Self-pay | Admitting: *Deleted

## 2022-08-10 ENCOUNTER — Emergency Department (HOSPITAL_COMMUNITY)
Admission: EM | Admit: 2022-08-10 | Discharge: 2022-08-10 | Discharge status: Against Medical Advice | Payer: Medicaid Other | Attending: Student | Admitting: Student

## 2022-08-10 ENCOUNTER — Other Ambulatory Visit: Payer: Self-pay

## 2022-08-10 DIAGNOSIS — M546 Pain in thoracic spine: Secondary | ICD-10-CM | POA: Insufficient documentation

## 2022-08-10 DIAGNOSIS — Z5321 Procedure and treatment not carried out due to patient leaving prior to being seen by health care provider: Secondary | ICD-10-CM | POA: Insufficient documentation

## 2022-08-10 DIAGNOSIS — M545 Low back pain, unspecified: Secondary | ICD-10-CM | POA: Diagnosis not present

## 2022-08-10 HISTORY — DX: Dorsalgia, unspecified: M54.9
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
## 2022-09-11 DIAGNOSIS — M546 Pain in thoracic spine: Secondary | ICD-10-CM | POA: Diagnosis not present

## 2022-09-11 DIAGNOSIS — R6889 Other general symptoms and signs: Secondary | ICD-10-CM | POA: Diagnosis not present

## 2022-09-11 DIAGNOSIS — M542 Cervicalgia: Secondary | ICD-10-CM | POA: Diagnosis not present

## 2022-09-18 ENCOUNTER — Encounter: Payer: Self-pay | Admitting: Cardiology

## 2022-09-18 ENCOUNTER — Ambulatory Visit: Payer: 59 | Admitting: Cardiology

## 2022-09-18 VITALS — BP 149/85 | HR 98 | Resp 17 | Ht 67.0 in | Wt 190.2 lb

## 2022-09-18 DIAGNOSIS — E781 Pure hyperglyceridemia: Secondary | ICD-10-CM | POA: Diagnosis not present

## 2022-09-18 DIAGNOSIS — R072 Precordial pain: Secondary | ICD-10-CM

## 2022-09-18 DIAGNOSIS — R002 Palpitations: Secondary | ICD-10-CM

## 2022-09-18 DIAGNOSIS — F1210 Cannabis abuse, uncomplicated: Secondary | ICD-10-CM | POA: Diagnosis not present

## 2022-09-18 DIAGNOSIS — I10 Essential (primary) hypertension: Secondary | ICD-10-CM | POA: Diagnosis not present

## 2022-09-18 DIAGNOSIS — F1010 Alcohol abuse, uncomplicated: Secondary | ICD-10-CM

## 2022-09-18 DIAGNOSIS — R0609 Other forms of dyspnea: Secondary | ICD-10-CM | POA: Diagnosis not present

## 2022-09-18 MED ORDER — AMLODIPINE BESYLATE 10 MG PO TABS: 10.0000 mg | ORAL_TABLET | Freq: Every day | ORAL | 3 refills | Status: DC
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
## 2022-09-27 ENCOUNTER — Other Ambulatory Visit: Payer: 59

## 2022-10-02 ENCOUNTER — Ambulatory Visit: Payer: 59

## 2022-10-02 DIAGNOSIS — R072 Precordial pain: Secondary | ICD-10-CM | POA: Diagnosis not present

## 2022-10-02 LAB — PCV MYOCARDIAL PERFUSION WITH LEXISCAN: ST Depression (mm): 0 mm

## 2022-10-09 NOTE — Progress Notes (Signed)
Called patient no answer.

## 2022-10-10 NOTE — Progress Notes (Signed)
Called and spoke with patient regarding his stress results.

## 2022-10-23 ENCOUNTER — Ambulatory Visit: Payer: 59

## 2022-10-23 DIAGNOSIS — R002 Palpitations: Secondary | ICD-10-CM

## 2022-10-23 DIAGNOSIS — R072 Precordial pain: Secondary | ICD-10-CM

## 2022-10-25 DIAGNOSIS — N400 Enlarged prostate without lower urinary tract symptoms: Secondary | ICD-10-CM | POA: Insufficient documentation
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
## 2022-10-26 DIAGNOSIS — R32 Unspecified urinary incontinence: Secondary | ICD-10-CM | POA: Diagnosis not present

## 2022-10-26 DIAGNOSIS — N39498 Other specified urinary incontinence: Secondary | ICD-10-CM | POA: Diagnosis not present

## 2022-10-31 ENCOUNTER — Ambulatory Visit: Payer: 59 | Admitting: Urology

## 2022-10-31 DIAGNOSIS — N400 Enlarged prostate without lower urinary tract symptoms: Secondary | ICD-10-CM

## 2022-10-31 DIAGNOSIS — R81 Glycosuria: Secondary | ICD-10-CM

## 2022-10-31 DIAGNOSIS — R32 Unspecified urinary incontinence: Secondary | ICD-10-CM

## 2022-11-02 NOTE — Progress Notes (Signed)
Called and spoke with patient regarding his echocardiogram results.  ?

## 2022-11-06 ENCOUNTER — Encounter: Payer: Self-pay | Admitting: Cardiology

## 2022-11-06 ENCOUNTER — Ambulatory Visit: Payer: 59 | Admitting: Cardiology

## 2022-11-06 VITALS — BP 154/102 | HR 87 | Ht 67.0 in | Wt 190.0 lb

## 2022-11-06 DIAGNOSIS — E781 Pure hyperglyceridemia: Secondary | ICD-10-CM | POA: Diagnosis not present

## 2022-11-06 DIAGNOSIS — I10 Essential (primary) hypertension: Secondary | ICD-10-CM | POA: Diagnosis not present

## 2022-11-06 DIAGNOSIS — F1210 Cannabis abuse, uncomplicated: Secondary | ICD-10-CM | POA: Diagnosis not present

## 2022-11-06 DIAGNOSIS — R072 Precordial pain: Secondary | ICD-10-CM | POA: Diagnosis not present

## 2022-11-06 DIAGNOSIS — R0609 Other forms of dyspnea: Secondary | ICD-10-CM | POA: Diagnosis not present

## 2022-11-06 DIAGNOSIS — F1010 Alcohol abuse, uncomplicated: Secondary | ICD-10-CM | POA: Diagnosis not present

## 2022-11-06 DIAGNOSIS — R002 Palpitations: Secondary | ICD-10-CM | POA: Diagnosis not present

## 2022-11-06 MED ORDER — DILTIAZEM HCL ER COATED BEADS 180 MG PO CP24
180.0000 mg | ORAL_CAPSULE | Freq: Every day | ORAL | 0 refills | Status: AC
Start: 2022-11-06 — End: 2023-02-04

## 2022-11-06 MED ORDER — HYDROCHLOROTHIAZIDE 25 MG PO TABS
25.0000 mg | ORAL_TABLET | Freq: Every morning | ORAL | 0 refills | Status: AC
Start: 2022-11-06 — End: 2022-12-06

## 2022-11-06 MED ORDER — LOSARTAN POTASSIUM 25 MG PO TABS
25.0000 mg | ORAL_TABLET | Freq: Every day | ORAL | 0 refills | Status: AC
Start: 2022-11-06 — End: 2022-12-06
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
## 2022-11-27 DIAGNOSIS — N39498 Other specified urinary incontinence: Secondary | ICD-10-CM | POA: Diagnosis not present

## 2022-11-27 DIAGNOSIS — R32 Unspecified urinary incontinence: Secondary | ICD-10-CM | POA: Diagnosis not present

## 2022-11-28 DIAGNOSIS — M5412 Radiculopathy, cervical region: Secondary | ICD-10-CM | POA: Diagnosis not present

## 2022-12-11 DIAGNOSIS — R5383 Other fatigue: Secondary | ICD-10-CM | POA: Diagnosis not present

## 2022-12-11 DIAGNOSIS — M25552 Pain in left hip: Secondary | ICD-10-CM | POA: Diagnosis not present

## 2022-12-11 DIAGNOSIS — R0602 Shortness of breath: Secondary | ICD-10-CM | POA: Diagnosis not present

## 2022-12-11 DIAGNOSIS — M549 Dorsalgia, unspecified: Secondary | ICD-10-CM | POA: Diagnosis not present

## 2022-12-11 DIAGNOSIS — M25562 Pain in left knee: Secondary | ICD-10-CM | POA: Diagnosis not present

## 2022-12-11 DIAGNOSIS — D539 Nutritional anemia, unspecified: Secondary | ICD-10-CM | POA: Diagnosis not present

## 2022-12-11 DIAGNOSIS — M129 Arthropathy, unspecified: Secondary | ICD-10-CM | POA: Diagnosis not present

## 2022-12-11 DIAGNOSIS — Z125 Encounter for screening for malignant neoplasm of prostate: Secondary | ICD-10-CM | POA: Diagnosis not present

## 2022-12-11 DIAGNOSIS — Z131 Encounter for screening for diabetes mellitus: Secondary | ICD-10-CM | POA: Diagnosis not present

## 2022-12-11 DIAGNOSIS — Z1159 Encounter for screening for other viral diseases: Secondary | ICD-10-CM | POA: Diagnosis not present

## 2022-12-11 DIAGNOSIS — E559 Vitamin D deficiency, unspecified: Secondary | ICD-10-CM | POA: Diagnosis not present

## 2022-12-11 DIAGNOSIS — Z79891 Long term (current) use of opiate analgesic: Secondary | ICD-10-CM | POA: Diagnosis not present

## 2022-12-11 DIAGNOSIS — E7800 Pure hypercholesterolemia, unspecified: Secondary | ICD-10-CM | POA: Diagnosis not present

## 2022-12-12 DIAGNOSIS — R002 Palpitations: Secondary | ICD-10-CM | POA: Diagnosis not present

## 2022-12-13 NOTE — Progress Notes (Signed)
No answer left a vm

## 2022-12-14 DIAGNOSIS — Z79899 Other long term (current) drug therapy: Secondary | ICD-10-CM | POA: Diagnosis not present

## 2022-12-14 NOTE — Progress Notes (Signed)
2nd attempt : Called patient, NA, LMAM

## 2022-12-27 DIAGNOSIS — R300 Dysuria: Secondary | ICD-10-CM | POA: Diagnosis not present

## 2022-12-27 DIAGNOSIS — R1032 Left lower quadrant pain: Secondary | ICD-10-CM | POA: Diagnosis not present

## 2022-12-27 DIAGNOSIS — R7303 Prediabetes: Secondary | ICD-10-CM | POA: Diagnosis not present

## 2022-12-28 ENCOUNTER — Other Ambulatory Visit: Payer: Self-pay | Admitting: Registered Nurse

## 2022-12-28 DIAGNOSIS — R1032 Left lower quadrant pain: Secondary | ICD-10-CM

## 2022-12-28 DIAGNOSIS — R32 Unspecified urinary incontinence: Secondary | ICD-10-CM | POA: Diagnosis not present

## 2022-12-28 DIAGNOSIS — N39498 Other specified urinary incontinence: Secondary | ICD-10-CM | POA: Diagnosis not present

## 2023-01-04 ENCOUNTER — Other Ambulatory Visit: Payer: 59

## 2023-01-09 ENCOUNTER — Inpatient Hospital Stay: Admission: RE | Admit: 2023-01-09 | Payer: 59 | Source: Ambulatory Visit

## 2023-01-15 ENCOUNTER — Other Ambulatory Visit: Payer: 59

## 2023-01-17 ENCOUNTER — Ambulatory Visit
Admission: RE | Admit: 2023-01-17 | Discharge: 2023-01-17 | Disposition: A | Discharge status: Home / Self Care | Payer: 59 | Source: Ambulatory Visit | Attending: Registered Nurse | Admitting: Registered Nurse

## 2023-01-17 DIAGNOSIS — R1032 Left lower quadrant pain: Secondary | ICD-10-CM | POA: Diagnosis not present

## 2023-01-17 DIAGNOSIS — R109 Unspecified abdominal pain: Secondary | ICD-10-CM | POA: Diagnosis not present

## 2023-01-22 ENCOUNTER — Encounter: Payer: Self-pay | Admitting: Gastroenterology

## 2023-02-07 ENCOUNTER — Ambulatory Visit: Payer: 59 | Admitting: Cardiology

## 2023-02-14 NOTE — Progress Notes (Signed)
Provide him the refill.   Agree w/ ED if needed otherwise have him follow up w/ APP.   Josiephine Simao Webster, DO, [facility]

## 2023-04-15 ENCOUNTER — Ambulatory Visit: Payer: 59 | Admitting: Gastroenterology
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
## 2023-04-29 DIAGNOSIS — I10 Essential (primary) hypertension: Secondary | ICD-10-CM | POA: Diagnosis not present

## 2023-05-10 ENCOUNTER — Ambulatory Visit: Payer: 59 | Attending: Cardiology | Admitting: Cardiology

## 2023-05-14 ENCOUNTER — Encounter: Payer: Self-pay | Admitting: Gastroenterology

## 2023-06-03 DIAGNOSIS — I10 Essential (primary) hypertension: Secondary | ICD-10-CM | POA: Diagnosis not present

## 2023-06-05 NOTE — Progress Notes (Deleted)
  Cardiology Office Note:  .   Date:  06/05/2023  ID:  Steve Marshall, DOB [birth year], MRN [id number] PCP: Levonne Lapping, NP  [facility] Providers Cardiologist:  Tessa Lerner, DO { Click to update primary MD,subspecialty MD or APP then REFRESH:1}   History of Present Illness: .   Steve Marshall is a 48 y.o. male  with history and cardiovascular risk factors include: former smoker, history of cocaine use, marijuana abuse, EtOH abuse, hypertension, prediabetes, hypertriglyceridemia, family history of heart disease.  Patient saw Dr. Odis Hollingshead for palpitations felt due to dehydration from excessive alcohol consumption. Started on diltiazem and avoid BB with cocaine use. Chest pain with Low risk NST 09/2022, echo normal LVEF. Monitor showed NSR-ST 62-168 but he had stopped his diltiazem due to affordability.  ROS: ***  Studies Reviewed: Marland Kitchen         Prior CV Studies: {Select studies to display:26339}  Zio 10/2022 Cardiac monitor (Zio Patch): October 23, 2022-October 26, 2022 Dominant rhythm sinus, followed by tachycardia (47% burden). Heart rate 62-168 bpm.  Avg HR 101 bpm. No atrial fibrillation, supraventricular tachycardia, ventricular tachycardia, high grade AV block, pauses (3 seconds or longer). Total ventricular ectopic burden <1%. Total supraventricular ectopic burden <1%. Patient triggered events: 3.  Underlying rhythm sinus without ectopy.    Echocardiogram: 10/23/2022: EKG illustrate either sinus or sinus tachycardia.  Normal LV systolic function with visual EF 60-65%. Left ventricle cavity is normal in size. Normal left ventricular wall thickness.  Normal global wall motion. Normal diastolic filling pattern, normal LAP.  Trace tricuspid regurgitation. No evidence of pulmonary hypertension. No significant valvular heart disease.  No prior study for comparison.      Stress Testing: Regadenoson Nuclear stress test 10/02/2022: Myocardial perfusion is normal. Overall LV  systolic function is normal without regional wall motion abnormalities. Stress LV EF: 67%.  Nondiagnostic ECG stress. The heart rate response was consistent with Regadenoson.  The blood pressure response was hypertensive. The baseline blood pressure was 170/110 mmHg. Maximum blood pressure post injection was 160/108 mmHg. No previous exam available for comparison. Low risk.     Risk Assessment/Calculations:   {Does this patient have ATRIAL FIBRILLATION?:[phone]} No BP recorded.  {Refresh Note OR Click here to enter BP  :1}***       Physical Exam:   VS:  There were no vitals taken for this visit.   Wt Readings from Last 3 Encounters:  11/06/22 190 lb (86.2 kg)  09/18/22 190 lb 3.2 oz (86.3 kg)  08/10/22 220 lb (99.8 kg)    GEN: Well nourished, well developed in no acute distress NECK: No JVD; No carotid bruits CARDIAC: ***RRR, no murmurs, rubs, gallops RESPIRATORY:  Clear to auscultation without rales, wheezing or rhonchi  ABDOMEN: Soft, non-tender, non-distended EXTREMITIES:  No edema; No deformity   ASSESSMENT AND PLAN: .    Chest pain-low risk NST and normal echo 09/2022  Palpitations with NSR-ST on Zio started on cardizem 180 mg but stopped  HTN  HLD  Marijuana and alcohol abuse     {Are you ordering a CV Procedure (e.g. stress test, cath, DCCV, TEE, etc)?   Press F2        :[id number]}  Dispo: ***  Signed, Jacolyn Reedy, PA-C

## 2023-06-10 ENCOUNTER — Ambulatory Visit: Payer: 59 | Admitting: Gastroenterology
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
## 2023-06-17 ENCOUNTER — Ambulatory Visit: Payer: 59 | Attending: Physician Assistant | Admitting: Physician Assistant

## 2023-10-17 ENCOUNTER — Encounter: Payer: Self-pay | Admitting: Internal Medicine
